# Patient Record
Sex: Female | Born: 1965 | Race: White | Hispanic: No | Marital: Married | Smoking: Never smoker
Health system: Southern US, Community
[De-identification: ages and names within clinical notes are randomized; demographics above are authoritative.]

---

## 1997-11-10 ENCOUNTER — Other Ambulatory Visit: Admission: RE | Admit: 1997-11-10 | Discharge: 1997-11-10 | Payer: Self-pay | Admitting: Obstetrics and Gynecology

## 1999-03-11 ENCOUNTER — Inpatient Hospital Stay (HOSPITAL_COMMUNITY): Admission: AD | Admit: 1999-03-11 | Discharge: 1999-03-13 | Payer: Self-pay | Admitting: Obstetrics and Gynecology

## 1999-03-16 ENCOUNTER — Encounter (HOSPITAL_COMMUNITY): Admission: RE | Admit: 1999-03-16 | Discharge: 1999-06-14 | Payer: Self-pay | Admitting: *Deleted

## 1999-05-05 ENCOUNTER — Other Ambulatory Visit: Admission: RE | Admit: 1999-05-05 | Discharge: 1999-05-05 | Payer: Self-pay | Admitting: Gynecology

## 1999-06-16 ENCOUNTER — Encounter: Admission: RE | Admit: 1999-06-16 | Discharge: 1999-09-14 | Payer: Self-pay | Admitting: *Deleted

## 2000-12-15 ENCOUNTER — Other Ambulatory Visit: Admission: RE | Admit: 2000-12-15 | Discharge: 2000-12-15 | Payer: Self-pay | Admitting: Obstetrics and Gynecology

## 2002-04-12 ENCOUNTER — Other Ambulatory Visit: Admission: RE | Admit: 2002-04-12 | Discharge: 2002-04-12 | Payer: Self-pay | Admitting: Obstetrics and Gynecology

## 2002-07-20 ENCOUNTER — Inpatient Hospital Stay (HOSPITAL_COMMUNITY): Admission: AD | Admit: 2002-07-20 | Discharge: 2002-07-20 | Payer: Self-pay | Admitting: *Deleted

## 2002-10-19 ENCOUNTER — Inpatient Hospital Stay (HOSPITAL_COMMUNITY): Admission: AD | Admit: 2002-10-19 | Discharge: 2002-10-19 | Payer: Self-pay | Admitting: Obstetrics and Gynecology

## 2002-10-30 ENCOUNTER — Inpatient Hospital Stay (HOSPITAL_COMMUNITY): Admission: AD | Admit: 2002-10-30 | Discharge: 2002-11-01 | Payer: Self-pay | Admitting: Obstetrics and Gynecology

## 2002-11-02 ENCOUNTER — Encounter: Admission: RE | Admit: 2002-11-02 | Discharge: 2002-12-02 | Payer: Self-pay | Admitting: Obstetrics and Gynecology

## 2002-12-03 ENCOUNTER — Encounter: Admission: RE | Admit: 2002-12-03 | Discharge: 2003-01-02 | Payer: Self-pay | Admitting: Obstetrics and Gynecology

## 2002-12-13 ENCOUNTER — Other Ambulatory Visit: Admission: RE | Admit: 2002-12-13 | Discharge: 2002-12-13 | Payer: Self-pay | Admitting: Obstetrics and Gynecology

## 2003-02-02 ENCOUNTER — Encounter: Admission: RE | Admit: 2003-02-02 | Discharge: 2003-03-04 | Payer: Self-pay | Admitting: Obstetrics and Gynecology

## 2003-03-05 ENCOUNTER — Encounter: Admission: RE | Admit: 2003-03-05 | Discharge: 2003-04-04 | Payer: Self-pay | Admitting: Obstetrics and Gynecology

## 2003-05-06 ENCOUNTER — Ambulatory Visit (HOSPITAL_COMMUNITY): Admission: RE | Admit: 2003-05-06 | Discharge: 2003-05-06 | Payer: Self-pay | Admitting: Orthopedic Surgery

## 2003-05-06 ENCOUNTER — Ambulatory Visit (HOSPITAL_BASED_OUTPATIENT_CLINIC_OR_DEPARTMENT_OTHER): Admission: RE | Admit: 2003-05-06 | Discharge: 2003-05-06 | Payer: Self-pay | Admitting: Orthopedic Surgery

## 2005-03-30 ENCOUNTER — Emergency Department (HOSPITAL_COMMUNITY): Admission: EM | Admit: 2005-03-30 | Discharge: 2005-03-30 | Payer: Self-pay | Admitting: Emergency Medicine

## 2005-08-31 ENCOUNTER — Other Ambulatory Visit: Admission: RE | Admit: 2005-08-31 | Discharge: 2005-08-31 | Payer: Self-pay | Admitting: Obstetrics and Gynecology

## 2006-09-28 ENCOUNTER — Encounter: Admission: RE | Admit: 2006-09-28 | Discharge: 2006-09-28 | Payer: Self-pay | Admitting: Obstetrics and Gynecology

## 2008-06-12 ENCOUNTER — Encounter: Admission: RE | Admit: 2008-06-12 | Discharge: 2008-06-12 | Payer: Self-pay | Admitting: Obstetrics and Gynecology

## 2008-10-13 ENCOUNTER — Other Ambulatory Visit (HOSPITAL_COMMUNITY): Admission: RE | Admit: 2008-10-13 | Discharge: 2008-10-13 | Payer: Self-pay | Admitting: Psychiatry

## 2009-04-04 ENCOUNTER — Encounter: Admission: RE | Admit: 2009-04-04 | Discharge: 2009-04-04 | Payer: Self-pay | Admitting: Family Medicine

## 2009-04-13 ENCOUNTER — Encounter: Admission: RE | Admit: 2009-04-13 | Discharge: 2009-04-13 | Payer: Self-pay | Admitting: Family Medicine

## 2009-06-15 ENCOUNTER — Encounter: Admission: RE | Admit: 2009-06-15 | Discharge: 2009-06-15 | Payer: Self-pay | Admitting: Obstetrics and Gynecology

## 2010-08-31 ENCOUNTER — Other Ambulatory Visit: Payer: Self-pay | Admitting: Obstetrics and Gynecology

## 2010-08-31 DIAGNOSIS — Z1231 Encounter for screening mammogram for malignant neoplasm of breast: Secondary | ICD-10-CM

## 2010-09-02 ENCOUNTER — Ambulatory Visit: Payer: Self-pay

## 2010-09-10 ENCOUNTER — Ambulatory Visit
Admission: RE | Admit: 2010-09-10 | Discharge: 2010-09-10 | Disposition: A | Discharge status: Home / Self Care | Payer: BC Managed Care – PPO | Source: Ambulatory Visit | Attending: Obstetrics and Gynecology | Admitting: Obstetrics and Gynecology

## 2010-09-10 DIAGNOSIS — Z1231 Encounter for screening mammogram for malignant neoplasm of breast: Secondary | ICD-10-CM

## 2010-11-19 NOTE — Op Note (Signed)
NAMESOMAYA, Becky Sawyer                  ACCOUNT NO.:  [id number]   MEDICAL RECORD NO.:  [id number]                   PATIENT TYPE:  AMB   LOCATION:  DSC                                  FACILITY:  [facility]   PHYSICIAN:  Katy Fitch. Naaman Plummer., M.D.          DATE OF BIRTH:  [birth year]   DATE OF PROCEDURE:  05/07/2003  DATE OF DISCHARGE:                                 OPERATIVE REPORT   PREOPERATIVE DIAGNOSES:  Impacted angulated comminuted fracture of right  small finger, metacarpal head and neck, intra-articular at metacarpal  phalangeal joint.   POSTOPERATIVE DIAGNOSES:  Impacted angulated comminuted fracture of right  small finger, metacarpal head and neck, intra-articular at metacarpal  phalangeal joint.   OPERATION:  Closed reduction, right small finger metacarpal head and neck  fracture with a percutaneous 0.035 inch and 0.045 inch Kirschner wire  fixation of articular head and neck fracture.   SURGEON:  Katy Fitch. Sypher, M.D.   ASSISTANT:  Jonni Sanger, P.A.   ANESTHESIA:  General by LMA.   ANESTHESIOLOGIST:  Maren Beach, M.D.   INDICATIONS FOR PROCEDURE:  The patient is a 45 year old, right-hand  dominant woman who fractured her right small finger metacarpal head and neck  two days prior to surgery.  She was seen at [facility], where x-rays revealed a displaced impacted angulated intra-  articular fracture of her right small finger metacarpal.  She was referred  for an urgent hand surgery consultation on May 05, 2003.  A clinical  examination at that time revealed gross swelling of her hand.  She had both  angular deformity of her metacarpal apex dorsal, and shortening of her  metacarpal.  She had an extensor lag at her PIP joint.  Her x-rays were  reviewed, documenting her fracture.  We advised that closed treatment of this would be unsuccessful, and would  likely lead to shortening of the metacarpal length, loss of the  contour of  the knuckle, and  a PIP extensor lag.  We recommended a delayed primary  reduction and Kirschner wire fixation, versus an open reduction and internal  fixation.  After an informed consent, she is brought to the operating room  at this time.   DESCRIPTION OF PROCEDURE:  The patient is brought to the operating room and  placed in the supine position on the operating room table.  Following  induction of general anesthesia by LMA, the right arm was prepped with  Betadine soap and solution and sterilely draped.  A C-arm fluoroscope was  brought into position, and the fracture examined.  After exsanguination of  the limb with an Esmarch bandage, an arterial tourniquet on the proximal  brachium was inflated to 220 mmHg.  A finger trap was initially used to  reduce the fracture with traction and three-point molding.  Once the  fracture was reduced, we carefully placed the finger into 90/90 position to  allow proper alignment with respect to  rotation.  The fracture remained  anatomic.  We then secured the fracture with an intramedullary 0.035 inch  Kirschner wire drilled through the extensor mechanism with the MP joint at  80 degrees flexion, and the IP joint in full flexion, followed by a  transverse pin through the distal portion of the articular fracture,  securing the head fragments into the ring finger metacarpal.  An anatomic  reduction was achieved.  The pins were then prepped in the usual manner with Xeroflo on the skin and  pin caps.  A compressive dressing was applied with dorsal and palmar plaster  sandwich splints, maintaining the hand in a safe position.   DISPOSITION/FOLLOWUP:  For after-care the patient is advised to keep her  dressing meticulously dry and clean.  As she has a 59-month-old child, she  will need considerable help from her family.  We anticipate her return to  the office for a check x-ray in seven days, and for after-care she was given  prescriptions for  Percocet 5 mg, one or two tab p.o. q.4-6h. p.r.n. pain and  _________5 mg, one p.o. q.8h. x4 days as a prophylactic antibiotic.   COMPLICATIONS:  There were no apparent complications.  She is awakened from anesthesia and transferred to the recovery room with  stable vital signs.                                               Katy Fitch Naaman Plummer., M.D.    RVS/MEDQ  D:  05/06/2003  T:  05/07/2003  Job:  [id number]   cc:   Candyce Churn, M.D.  [address] [place]  [place] [address]  Fax: [phone]

## 2012-10-24 ENCOUNTER — Other Ambulatory Visit: Payer: Self-pay | Admitting: Obstetrics and Gynecology

## 2012-10-24 DIAGNOSIS — N644 Mastodynia: Secondary | ICD-10-CM

## 2012-10-25 ENCOUNTER — Ambulatory Visit
Admission: RE | Admit: 2012-10-25 | Discharge: 2012-10-25 | Disposition: A | Discharge status: Home / Self Care | Payer: BC Managed Care – PPO | Source: Ambulatory Visit | Attending: Obstetrics and Gynecology | Admitting: Obstetrics and Gynecology

## 2012-10-25 DIAGNOSIS — N644 Mastodynia: Secondary | ICD-10-CM

## 2013-09-19 ENCOUNTER — Other Ambulatory Visit: Payer: Self-pay

## 2013-09-19 DIAGNOSIS — Z1231 Encounter for screening mammogram for malignant neoplasm of breast: Secondary | ICD-10-CM

## 2013-10-28 ENCOUNTER — Ambulatory Visit: Payer: BC Managed Care – PPO

## 2014-02-18 ENCOUNTER — Ambulatory Visit: Payer: BC Managed Care – PPO

## 2014-03-21 ENCOUNTER — Encounter (INDEPENDENT_AMBULATORY_CARE_PROVIDER_SITE_OTHER): Payer: Self-pay

## 2014-03-21 ENCOUNTER — Ambulatory Visit
Admission: RE | Admit: 2014-03-21 | Discharge: 2014-03-21 | Disposition: A | Discharge status: Home / Self Care | Payer: BC Managed Care – PPO | Source: Ambulatory Visit

## 2014-03-21 DIAGNOSIS — Z1231 Encounter for screening mammogram for malignant neoplasm of breast: Secondary | ICD-10-CM

## 2015-04-01 ENCOUNTER — Other Ambulatory Visit: Payer: Self-pay

## 2015-04-01 DIAGNOSIS — Z1231 Encounter for screening mammogram for malignant neoplasm of breast: Secondary | ICD-10-CM

## 2015-04-07 ENCOUNTER — Ambulatory Visit
Admission: RE | Admit: 2015-04-07 | Discharge: 2015-04-07 | Disposition: A | Discharge status: Home / Self Care | Payer: BLUE CROSS/BLUE SHIELD | Source: Ambulatory Visit

## 2015-04-07 DIAGNOSIS — Z1231 Encounter for screening mammogram for malignant neoplasm of breast: Secondary | ICD-10-CM

## 2016-02-03 DIAGNOSIS — Z85828 Personal history of other malignant neoplasm of skin: Secondary | ICD-10-CM | POA: Insufficient documentation

## 2016-03-31 ENCOUNTER — Other Ambulatory Visit: Payer: Self-pay | Admitting: Obstetrics and Gynecology

## 2016-03-31 DIAGNOSIS — Z1231 Encounter for screening mammogram for malignant neoplasm of breast: Secondary | ICD-10-CM

## 2016-04-22 ENCOUNTER — Ambulatory Visit: Payer: BLUE CROSS/BLUE SHIELD

## 2016-06-13 ENCOUNTER — Ambulatory Visit: Payer: BLUE CROSS/BLUE SHIELD

## 2016-08-17 ENCOUNTER — Ambulatory Visit
Admission: RE | Admit: 2016-08-17 | Discharge: 2016-08-17 | Disposition: A | Discharge status: Home / Self Care | Payer: BLUE CROSS/BLUE SHIELD | Source: Ambulatory Visit | Attending: Obstetrics and Gynecology | Admitting: Obstetrics and Gynecology

## 2016-08-17 DIAGNOSIS — Z1231 Encounter for screening mammogram for malignant neoplasm of breast: Secondary | ICD-10-CM

## 2016-11-25 DIAGNOSIS — L988 Other specified disorders of the skin and subcutaneous tissue: Secondary | ICD-10-CM | POA: Insufficient documentation

## 2019-04-29 ENCOUNTER — Other Ambulatory Visit: Payer: Self-pay | Admitting: Obstetrics and Gynecology

## 2019-04-29 DIAGNOSIS — R5381 Other malaise: Secondary | ICD-10-CM

## 2019-05-03 ENCOUNTER — Other Ambulatory Visit: Payer: Self-pay | Admitting: Obstetrics and Gynecology

## 2019-05-03 DIAGNOSIS — M940 Chondrocostal junction syndrome [Tietze]: Secondary | ICD-10-CM | POA: Insufficient documentation

## 2019-05-03 DIAGNOSIS — N644 Mastodynia: Secondary | ICD-10-CM

## 2019-05-15 ENCOUNTER — Other Ambulatory Visit: Payer: BLUE CROSS/BLUE SHIELD

## 2019-07-25 DIAGNOSIS — C44712 Basal cell carcinoma of skin of right lower limb, including hip: Secondary | ICD-10-CM | POA: Diagnosis not present

## 2019-07-25 DIAGNOSIS — L700 Acne vulgaris: Secondary | ICD-10-CM | POA: Diagnosis not present

## 2019-08-06 ENCOUNTER — Ambulatory Visit: Payer: Self-pay | Attending: Internal Medicine

## 2019-08-12 DIAGNOSIS — E282 Polycystic ovarian syndrome: Secondary | ICD-10-CM | POA: Diagnosis not present

## 2019-08-12 DIAGNOSIS — R7301 Impaired fasting glucose: Secondary | ICD-10-CM | POA: Diagnosis not present

## 2019-08-19 DIAGNOSIS — L709 Acne, unspecified: Secondary | ICD-10-CM | POA: Diagnosis not present

## 2019-08-19 DIAGNOSIS — E282 Polycystic ovarian syndrome: Secondary | ICD-10-CM | POA: Diagnosis not present

## 2019-08-19 DIAGNOSIS — R7301 Impaired fasting glucose: Secondary | ICD-10-CM | POA: Diagnosis not present

## 2019-08-19 DIAGNOSIS — L680 Hirsutism: Secondary | ICD-10-CM | POA: Diagnosis not present

## 2019-09-19 DIAGNOSIS — F3342 Major depressive disorder, recurrent, in full remission: Secondary | ICD-10-CM | POA: Diagnosis not present

## 2019-10-26 ENCOUNTER — Ambulatory Visit: Payer: Self-pay

## 2020-01-07 ENCOUNTER — Other Ambulatory Visit: Payer: Self-pay

## 2020-01-17 ENCOUNTER — Ambulatory Visit
Admission: RE | Admit: 2020-01-17 | Discharge: 2020-01-17 | Disposition: A | Discharge status: Home / Self Care | Payer: Self-pay | Source: Ambulatory Visit | Attending: Obstetrics and Gynecology | Admitting: Obstetrics and Gynecology

## 2020-01-17 ENCOUNTER — Other Ambulatory Visit: Payer: Self-pay

## 2020-01-17 DIAGNOSIS — N644 Mastodynia: Secondary | ICD-10-CM

## 2020-07-29 IMAGING — MG DIGITAL DIAGNOSTIC BILAT W/ TOMO W/ CAD
6 of 10 series · 6 of 30 positions shown · non-contrast
Comparison: Previous exam(s).

CLINICAL DATA: 53-year-old presenting with intermittent focal pain
in the UPPER subareolar LEFT breast over the past several weeks.
Annual evaluation, RIGHT breast.

EXAM:
DIGITAL DIAGNOSTIC BILATERAL MAMMOGRAM WITH CAD AND TOMO
ULTRASOUND LEFT BREAST

[L MLO synth-2D (1 of 2)]
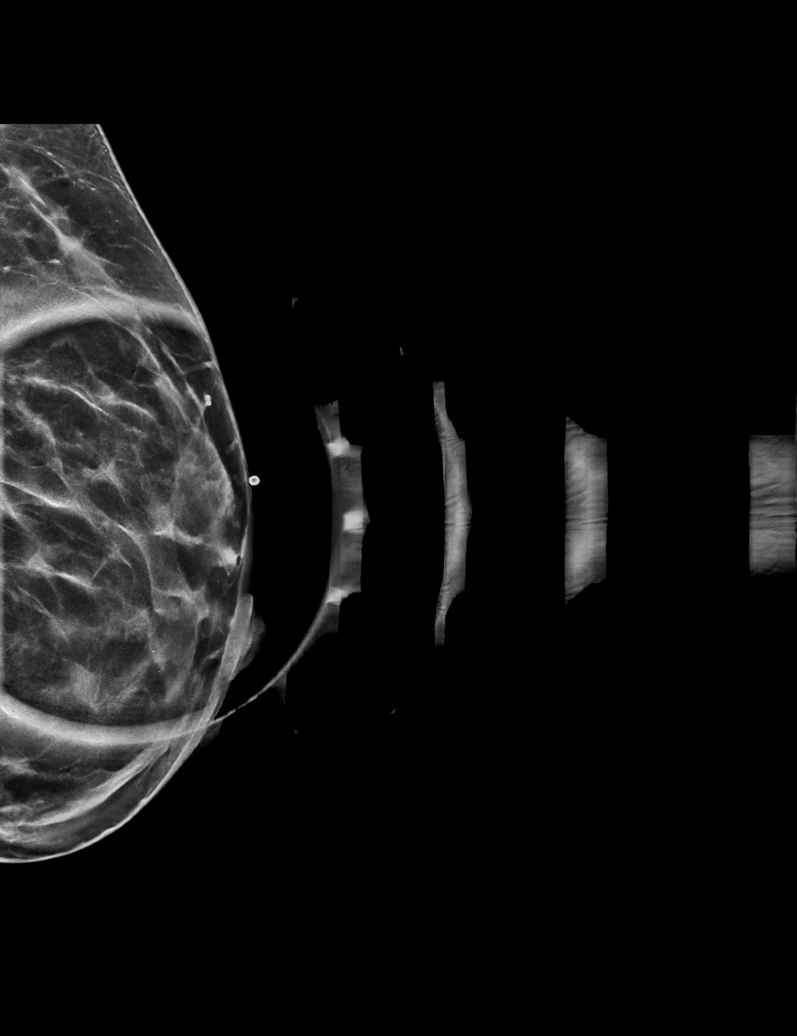

[L MLO synth-2D (2 of 2)]
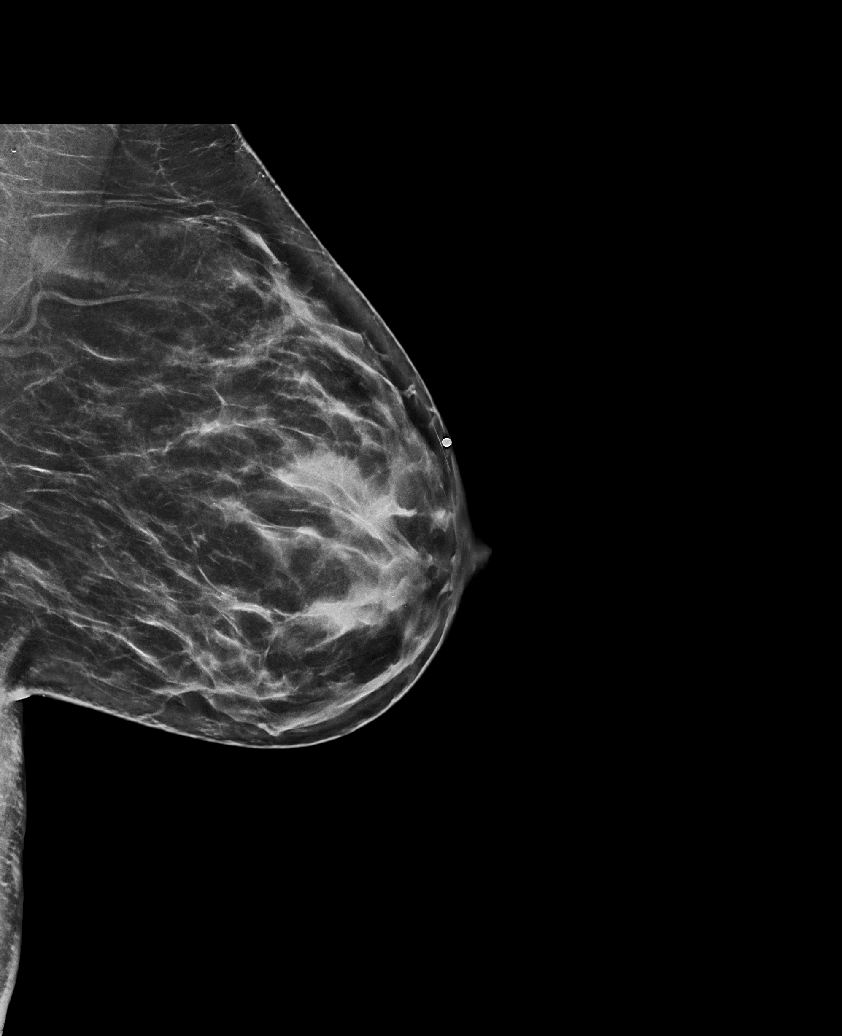

[L CC synth-2D]
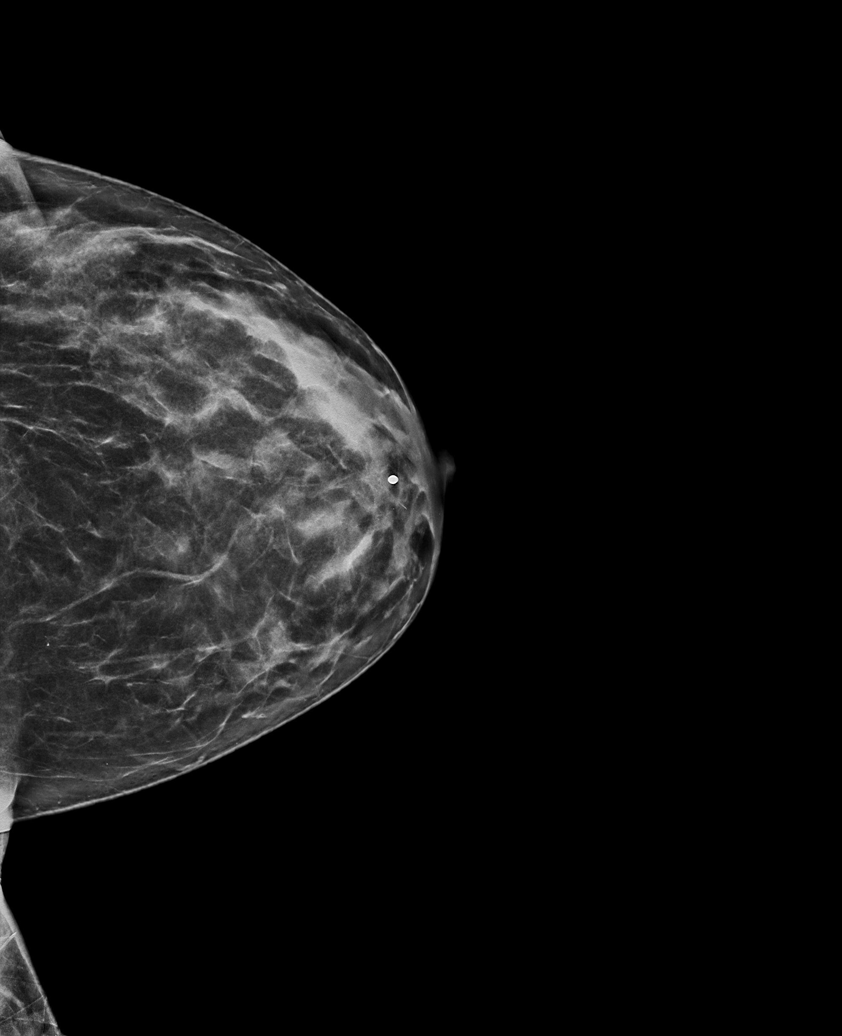

[R CC synth-2D]
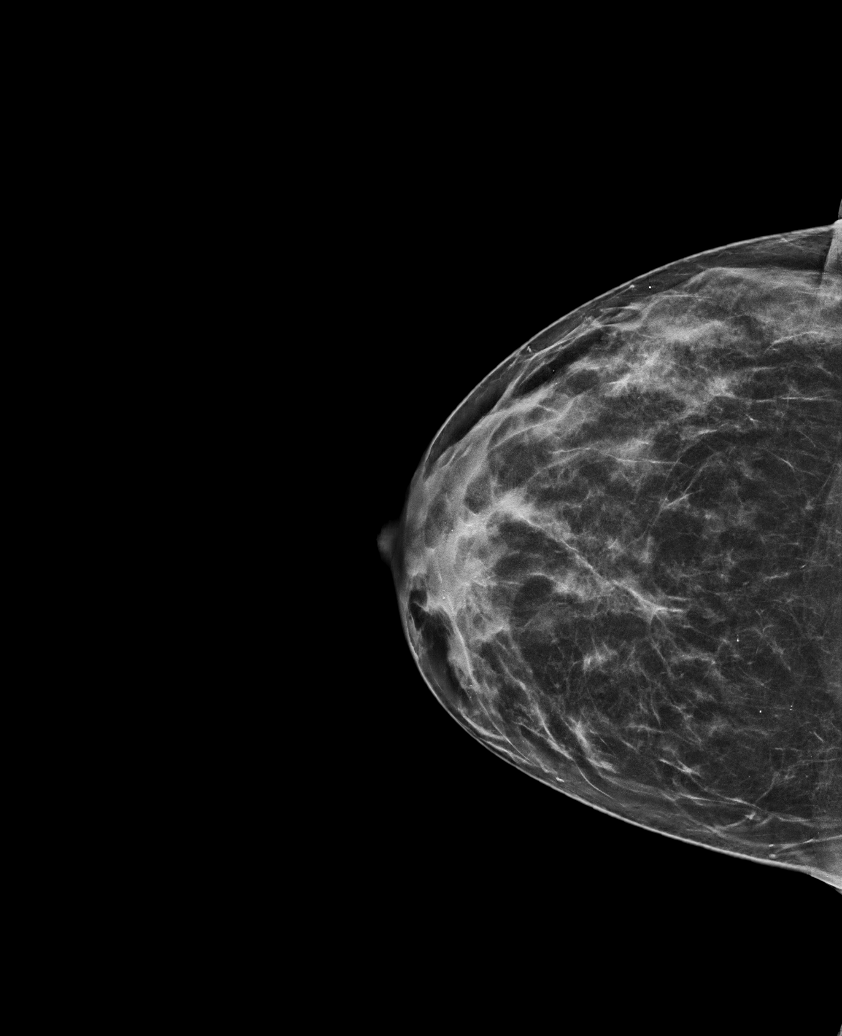

[R MLO synth-2D]
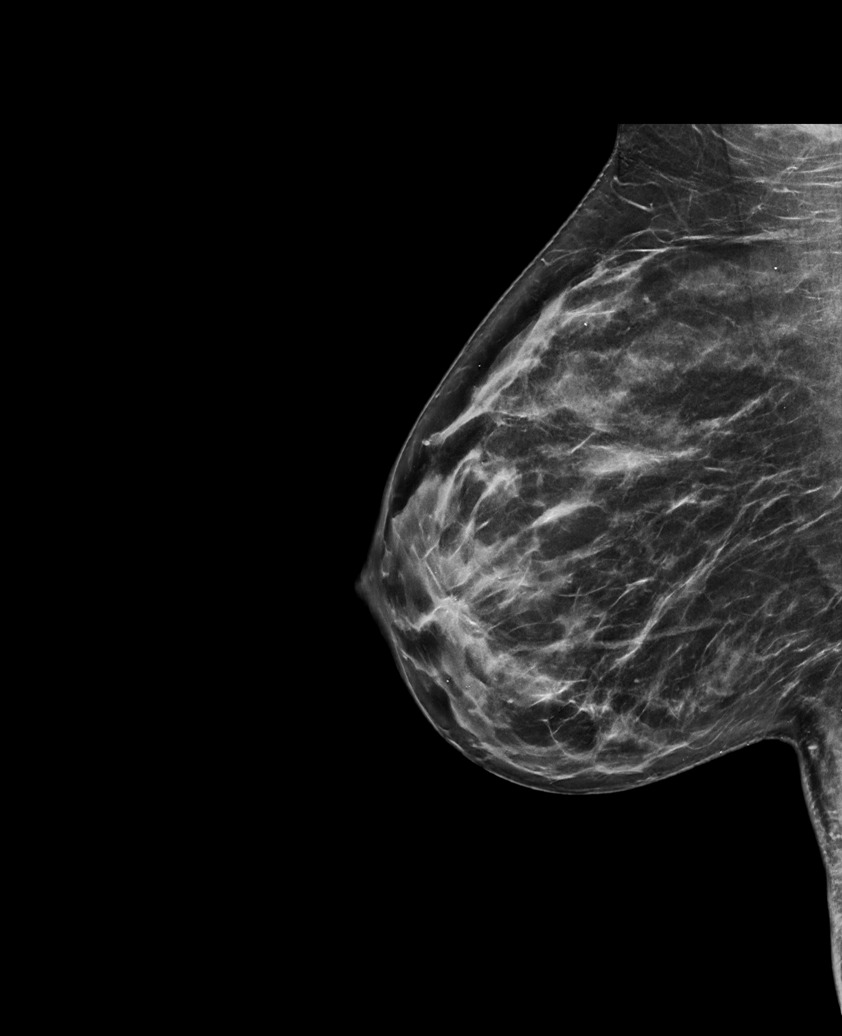

[L CC tomo · tomo slice 35/69.0]
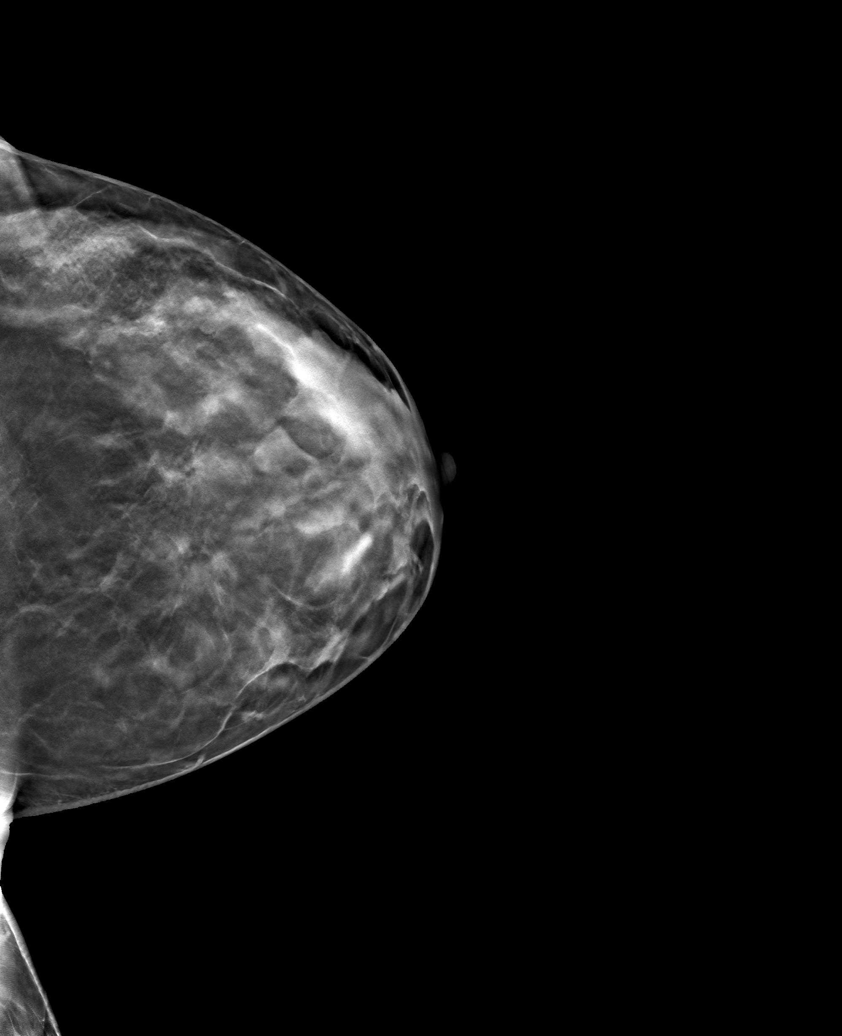

[6 of 30 positions shown; findings below may reference images not displayed]

ACR Breast Density Category c: The breast tissue is heterogeneously
dense, which may obscure small masses.
FINDINGS: Tomosynthesis and synthesized full field CC and MLO views of both
breasts were obtained. Tomosynthesis and synthesized spot
compression tangential view of the area of concern in the LEFT
breast was also obtained.

No mammographic abnormality in the UPPER subareolar LEFT breast in
the area of intermittent focal pain. Normal dense fibroglandular
tissue is present in this location. No findings suspicious for
malignancy in the LEFT breast.

No findings suspicious for malignancy in the RIGHT breast.

Mammographic images were processed with CAD.

Targeted LEFT breast ultrasound is performed, showing adjacent
benign simple cysts at the 12 o'clock position approximately 1 cm
from the nipple, the larger cyst measuring approximately 1.0 x 0.5 x
0.9 cm, both demonstrating posterior acoustic enhancement and no
internal power Doppler flow. No suspicious solid mass or abnormal
acoustic shadowing is identified.
IMPRESSION: 1. No mammographic or sonographic evidence of malignancy involving
the LEFT breast.
2. No mammographic evidence of malignancy involving the RIGHT
breast.
3. Benign simple cysts in the UPPER subareolar LEFT breast.

RECOMMENDATION:
Screening mammogram in one year.(Code:GL-C-48V)

Strategies for alleviating breast pain including decreasing caffeine
intake and vitamin-E supplementation were discussed with the
patient.

I have discussed the findings and recommendations with the patient.
If applicable, a reminder letter will be sent to the patient
regarding the next appointment.

BI-RADS CATEGORY  2: Benign.

## 2021-04-13 ENCOUNTER — Ambulatory Visit: Payer: Self-pay | Admitting: Neurology

## 2021-07-20 ENCOUNTER — Other Ambulatory Visit: Payer: Self-pay | Admitting: Obstetrics and Gynecology

## 2021-07-20 DIAGNOSIS — Z1231 Encounter for screening mammogram for malignant neoplasm of breast: Secondary | ICD-10-CM

## 2021-07-23 ENCOUNTER — Other Ambulatory Visit: Payer: Self-pay | Admitting: Nurse Practitioner

## 2021-07-23 DIAGNOSIS — R928 Other abnormal and inconclusive findings on diagnostic imaging of breast: Secondary | ICD-10-CM

## 2021-08-03 ENCOUNTER — Other Ambulatory Visit: Payer: Self-pay | Admitting: Nurse Practitioner

## 2021-08-03 DIAGNOSIS — N6310 Unspecified lump in the right breast, unspecified quadrant: Secondary | ICD-10-CM

## 2021-08-17 ENCOUNTER — Other Ambulatory Visit: Payer: Self-pay

## 2021-09-13 ENCOUNTER — Encounter: Payer: Self-pay | Admitting: Neurology

## 2021-09-13 ENCOUNTER — Telehealth: Payer: Self-pay | Admitting: Neurology

## 2021-09-13 ENCOUNTER — Ambulatory Visit: Payer: PRIVATE HEALTH INSURANCE | Admitting: Neurology

## 2021-09-13 VITALS — BP 100/68 | HR 97 | Ht 65.5 in | Wt 172.0 lb

## 2021-09-13 DIAGNOSIS — D2370 Other benign neoplasm of skin of unspecified lower limb, including hip: Secondary | ICD-10-CM | POA: Insufficient documentation

## 2021-09-13 DIAGNOSIS — F988 Other specified behavioral and emotional disorders with onset usually occurring in childhood and adolescence: Secondary | ICD-10-CM

## 2021-09-13 DIAGNOSIS — D225 Melanocytic nevi of trunk: Secondary | ICD-10-CM | POA: Insufficient documentation

## 2021-09-13 DIAGNOSIS — L719 Rosacea, unspecified: Secondary | ICD-10-CM | POA: Insufficient documentation

## 2021-09-13 DIAGNOSIS — R413 Other amnesia: Secondary | ICD-10-CM

## 2021-09-13 DIAGNOSIS — Z808 Family history of malignant neoplasm of other organs or systems: Secondary | ICD-10-CM | POA: Insufficient documentation

## 2021-09-13 DIAGNOSIS — Z87898 Personal history of other specified conditions: Secondary | ICD-10-CM | POA: Insufficient documentation

## 2021-09-13 NOTE — Telephone Encounter (Signed)
Referral sent to [facility] [phone]. ?
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
## 2021-09-13 NOTE — Telephone Encounter (Signed)
Referral sent to [facility] Attention Specialist [phone]. ?

## 2021-09-13 NOTE — Patient Instructions (Signed)
Continue current medications ?Referral to Neuropsych  ?Referral to Psychiatry [facility]) for management of ADD ?Follow up with your primary care physician  ?Return in 1 year  ? ? ?There are well-accepted and sensible ways to reduce risk for Alzheimers disease and other degenerative brain disorders . ? ?Exercise Daily Walk A daily 20 minute walk should be part of your routine. Disease related apathy can be a significant roadblock to exercise and the only way to overcome this is to make it a daily routine and perhaps have a reward at the end (something your loved one loves to eat or drink perhaps) or a personal trainer coming to the home can also be very useful. Most importantly, the patient is much more likely to exercise if the caregiver / spouse does it with him/her. In general a structured, repetitive schedule is best. ? ?General Health: Any diseases which effect your body will effect your brain such as a pneumonia, urinary infection, blood clot, heart attack or stroke. Keep contact with your primary care doctor for regular follow ups. ? ?Sleep. A good nights sleep is healthy for the brain. Seven hours is recommended. If you have insomnia or poor sleep habits we can give you some instructions. If you have sleep apnea wear your mask. ? ?Diet: Eating a heart healthy diet is also a good idea; fish and poultry instead of red meat, nuts (mostly non-peanuts), vegetables, fruits, olive oil or canola oil (instead of butter), minimal salt (use other spices to flavor foods), whole grain rice, bread, cereal and pasta and wine in moderation.Research is now showing that the MIND diet, which is a combination of The Mediterranean diet and the DASH diet, is beneficial for cognitive processing and longevity. Information about this diet can be found in The MIND Diet, a book by Doyne Keel, MS, RDN, and online at [employer].si ? ?Finances, Power of Producer, television/film/video  Directives: You should consider putting legal safeguards in place with regard to financial and medical decision making. While the spouse always has power of attorney for medical and financial issues in the absence of any form, you should consider what you want in case the spouse / caregiver is no longer around or capable of making decisions.  ? ? ? ?Heart-head connection ? ?New research shows there are things we can do to reduce the risk of mild cognitive impairment and dementia. ? ?Several conditions known to increase the risk of cardiovascular disease -- such as high blood pressure, diabetes and high cholesterol -- also increase the risk of developing Alzheimer's. Some autopsy studies show that as many as 58 percent of individuals with Alzheimer's disease also have cardiovascular disease. ? ?A longstanding question is why some people develop hallmark Alzheimer's plaques and tangles but do not develop the symptoms of Alzheimer's. Vascular disease may help researchers eventually find an answer. Some autopsy studies suggest that plaques and tangles may be present in the brain without causing symptoms of cognitive decline unless the brain also shows evidence of vascular disease. More research is needed to better understand the link between vascular health and Alzheimer's. ? ?Physical exercise and diet ?Regular physical exercise may be a beneficial strategy to lower the risk of Alzheimer's and vascular dementia. Exercise may directly benefit brain cells by increasing blood and oxygen flow in the brain. Because of its known cardiovascular benefits, a medically approved exercise program is a valuable part of any overall wellness plan. ? ?Current evidence suggests that heart-healthy eating may also help protect  the brain. Heart-healthy eating includes limiting the intake of sugar and saturated fats and making sure to eat plenty of fruits, vegetables, and whole grains. No one diet is best. Two diets that have been studied  and may be beneficial are the DASH (Dietary Approaches to Stop Hypertension) diet and the Mediterranean diet. The DASH diet emphasizes vegetables, fruits and fat-free or low-fat dairy products; includes whole grains, fish, poultry, beans, seeds, nuts and vegetable oils; and limits sodium, sweets, sugary beverages and red meats. A Mediterranean diet includes relatively little red meat and emphasizes whole grains, fruits and vegetables, fish and shellfish, and nuts, olive oil and other healthy fats. ? ?Social connections and intellectual activity ?A number of studies indicate that maintaining strong social connections and keeping mentally active as we age might lower the risk of cognitive decline and Alzheimer's. Experts are not certain about the reason for this association. It may be due to direct mechanisms through which social and mental stimulation strengthen connections between nerve cells in the brain. ? ?Head trauma ?There appears to be a strong link between future risk of Alzheimer's and serious head trauma, especially when injury involves loss of consciousness. You can help reduce your risk of Alzheimer's by protecting your head. ? Wear a seat belt ? Use a helmet when participating in sports ? "Fall-proof" your home ?  ?What you can do now ?While research is not yet conclusive, certain lifestyle choices, such as physical activity and diet, may help support brain health and prevent Alzheimer's. Many of these lifestyle changes have been shown to lower the risk of other diseases, like heart disease and diabetes, which have been linked to Alzheimer's. With few drawbacks and plenty of known benefits, healthy lifestyle choices can improve your health and possibly protect your brain. ? ?Learn more about brain health. ?You can help increase our knowledge by considering participation in a clinical study. Our free clinical trial matching services, TrialMatch?, can help you find clinical trials in your area that are  seeking volunteers. ? ?  ?

## 2021-09-14 LAB — DEMENTIA PANEL
Homocysteine: 7.5 umol/L (ref 0.0–14.5)
RPR Ser Ql: NONREACTIVE
TSH: 0.987 u[IU]/mL (ref 0.450–4.500)
Vitamin B-12: 447 pg/mL (ref 232–1245)

## 2021-09-15 ENCOUNTER — Telehealth: Payer: Self-pay | Admitting: *Deleted

## 2021-09-15 NOTE — Telephone Encounter (Signed)
Called and spoke with pt to inform of normal results. Patient verbalized understanding and expressed appreciation for the call. All questions answered. ?

## 2021-09-15 NOTE — Progress Notes (Signed)
Please call and advise the patient that the recent dementia labs we checked were within normal limits. We checked vitamin B12 level, homocysteine,  thyroid function and RPR. No further action is required on these tests at this time. Please remind patient to keep any upcoming appointments or tests and to call us with any interim questions, concerns, problems or updates. Thanks,  ? ?Becky Ran, MD ? ?

## 2021-09-15 NOTE — Telephone Encounter (Signed)
-----   Message from Alric Ran, MD sent at 09/15/2021  8:33 AM EDT ----- ?Please call and advise the patient that the recent dementia labs we checked were within normal limits. We checked vitamin B12 level, homocysteine,  thyroid function and RPR. No further action is required on these tests at this time. Please remind patient to keep any upcoming appointments or tests and to call us with any interim questions, concerns, problems or updates. Thanks,  ? ?Alric Ran, MD ? ?  ?

## 2021-09-21 NOTE — Telephone Encounter (Signed)
[employer] sent a message stating they have made several attempts to contact the patient to schedule- patient has not returned or answer their calls.  ?

## 2022-02-16 ENCOUNTER — Other Ambulatory Visit: Payer: Self-pay | Admitting: Obstetrics and Gynecology

## 2022-02-16 DIAGNOSIS — Z1231 Encounter for screening mammogram for malignant neoplasm of breast: Secondary | ICD-10-CM

## 2022-03-10 ENCOUNTER — Ambulatory Visit
Admission: RE | Admit: 2022-03-10 | Discharge: 2022-03-10 | Disposition: A | Discharge status: Home / Self Care | Payer: No Typology Code available for payment source | Source: Ambulatory Visit | Attending: Obstetrics and Gynecology | Admitting: Obstetrics and Gynecology

## 2022-03-10 DIAGNOSIS — Z1231 Encounter for screening mammogram for malignant neoplasm of breast: Secondary | ICD-10-CM

## 2022-08-08 DIAGNOSIS — E669 Obesity, unspecified: Secondary | ICD-10-CM | POA: Diagnosis not present

## 2022-08-08 DIAGNOSIS — R7301 Impaired fasting glucose: Secondary | ICD-10-CM | POA: Diagnosis not present

## 2022-08-08 DIAGNOSIS — E282 Polycystic ovarian syndrome: Secondary | ICD-10-CM | POA: Diagnosis not present

## 2022-08-15 DIAGNOSIS — E669 Obesity, unspecified: Secondary | ICD-10-CM | POA: Diagnosis not present

## 2022-08-15 DIAGNOSIS — R7301 Impaired fasting glucose: Secondary | ICD-10-CM | POA: Diagnosis not present

## 2022-08-15 DIAGNOSIS — E785 Hyperlipidemia, unspecified: Secondary | ICD-10-CM | POA: Diagnosis not present

## 2022-09-01 DIAGNOSIS — Z79899 Other long term (current) drug therapy: Secondary | ICD-10-CM | POA: Diagnosis not present

## 2022-09-01 DIAGNOSIS — L700 Acne vulgaris: Secondary | ICD-10-CM | POA: Diagnosis not present

## 2022-09-05 DIAGNOSIS — F331 Major depressive disorder, recurrent, moderate: Secondary | ICD-10-CM | POA: Diagnosis not present

## 2022-09-05 DIAGNOSIS — F900 Attention-deficit hyperactivity disorder, predominantly inattentive type: Secondary | ICD-10-CM | POA: Diagnosis not present

## 2022-09-05 DIAGNOSIS — G4700 Insomnia, unspecified: Secondary | ICD-10-CM | POA: Diagnosis not present

## 2022-09-13 DIAGNOSIS — M542 Cervicalgia: Secondary | ICD-10-CM | POA: Diagnosis not present

## 2022-09-13 DIAGNOSIS — M545 Low back pain, unspecified: Secondary | ICD-10-CM | POA: Diagnosis not present

## 2022-09-15 ENCOUNTER — Ambulatory Visit: Payer: Self-pay | Admitting: Neurology

## 2022-09-16 DIAGNOSIS — M7061 Trochanteric bursitis, right hip: Secondary | ICD-10-CM | POA: Diagnosis not present

## 2022-10-03 ENCOUNTER — Ambulatory Visit: Payer: Self-pay | Admitting: General Surgery

## 2022-10-03 DIAGNOSIS — Z79899 Other long term (current) drug therapy: Secondary | ICD-10-CM | POA: Diagnosis not present

## 2022-10-03 DIAGNOSIS — L700 Acne vulgaris: Secondary | ICD-10-CM | POA: Diagnosis not present

## 2022-10-03 DIAGNOSIS — K641 Second degree hemorrhoids: Secondary | ICD-10-CM | POA: Diagnosis not present

## 2022-10-03 NOTE — H&P (Signed)
PROVIDER:  Monico Blitz, MD   MRN: [id number] DOB: 01-22-1966 DATE OF ENCOUNTER: 10/03/2022   Subjective    Chief Complaint: No chief complaint on file.       History of Present Illness: Becky Sawyer is a 57 y.o. female who is seen today as an office consultation at the request of Dr. Pasty Arch for evaluation of No chief complaint on file.   Patient reports ongoing hemorrhoids issues since the er birth of her child.  She reports occasional leakage and blood on the toilet paper.  Most recent colonoscopy was approximately 1 year ago and normal except for hemorrhoids.  She denies any pain with bowel movements.  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   Review of Systems: A complete review of systems was obtained from the patient.  I have reviewed this information and discussed as appropriate with the patient.  See HPI as well for other ROS.       Medical History: Past Medical History  History reviewed. No pertinent past medical history.        Patient Active Problem List  Diagnosis   Personal history of other malignant neoplasm of skin      Past Surgical History  History reviewed. No pertinent surgical history.      Allergies  No Known Allergies           Current Outpatient Medications on File Prior to Visit  Medication Sig Dispense Refill   ARIPiprazole (ABILIFY) 2 MG tablet Take 2 mg by mouth once daily.       buPROPion (WELLBUTRIN) 75 MG tablet Take 75 mg by mouth 2 (two) times daily.       dextroamphetamine-amphetamine (ADDERALL) 15 mg tablet Take 15 mg by mouth.       traZODone (DESYREL) 50 MG tablet Take 50 mg by mouth 3 (three) times daily       acetaminophen-codeine (TYLENOL #3) 300-30 mg tablet One to two tablets by mouth every four to six hours as needed for pain (Patient not taking: Reported on 10/03/2022) 12 tablet 0   LORCASERIN HCL (BELVIQ ORAL) Take by mouth. (Patient not  taking: Reported on 10/03/2022)       oxyCODONE (ROXICODONE) 5 MG immediate release tablet Take 1 to 2 tablets by mouth every 4 to 6 hours as needed for pain relief (Patient not taking: Reported on 10/03/2022) 12 tablet 0   spironolactone (ALDACTONE) 25 MG tablet Take 25 mg by mouth once daily. (Patient not taking: Reported on 10/03/2022)       temazepam (RESTORIL) 15 mg capsule Take 15 mg by mouth nightly as needed for Sleep. (Patient not taking: Reported on 10/03/2022)        No current facility-administered medications on file prior to visit.      Family History  History reviewed. No pertinent family history.      Social History       Tobacco Use  Smoking Status Never  Smokeless Tobacco Never      Social History  Social History        Socioeconomic History   Marital status: Married  Tobacco Use   Smoking status: Never   Smokeless tobacco: Never  Substance and Sexual Activity   Alcohol use: Yes   Drug use: Never        Objective:         Vitals:    10/03/22 1437  PainSc: 0-No pain  Exam Gen: NAD Abd: soft Rectal: Circumferential external hemorrhoids without inflammation.  Prolapsing right anterior hemorrhoid     Labs, Imaging and Diagnostic Testing:   Procedure: Anoscopy Surgeon: Marcello Moores After the risks and benefits were explained, written consent was obtained for above procedure.  A medical assistant chaperone was present thoroughout the entire procedure.  Anesthesia: none Diagnosis: rectal drainage, bleeding Findings: Grade 2 inflamed right anterior internal hemorrhoid, grade 1 left lateral and grade 2 non-inflamed right posterior     Assessment and Plan:  Diagnoses and all orders for this visit:   Grade II hemorrhoids       Patient appears to have a prolapsing right anterior hemorrhoid which is causing most of her symptoms.  We had to do band this in the office today but there was no area to band that was not innervated.  I recommended surgical  excision.  We discussed this in detail including postoperative pain and return to work.  She would like to have the least amount of surgery done to correct her problem.  She feels that she has a poor pain tolerance and needs to get back to work as quick as possible.   All questions were answered.     Rosario Adie, MD Colon and Rectal Surgery [facility]

## 2022-10-20 ENCOUNTER — Other Ambulatory Visit (HOSPITAL_COMMUNITY): Payer: Self-pay

## 2022-10-20 ENCOUNTER — Other Ambulatory Visit: Payer: Self-pay

## 2022-10-21 ENCOUNTER — Other Ambulatory Visit (HOSPITAL_COMMUNITY): Payer: Self-pay

## 2022-10-21 MED ORDER — ZEPBOUND 5 MG/0.5ML ~~LOC~~ SOAJ
5.0000 mg | SUBCUTANEOUS | 5 refills | Status: AC
  Filled 2022-10-21: qty 2, 28d supply, fill #0

## 2022-10-24 ENCOUNTER — Other Ambulatory Visit (HOSPITAL_COMMUNITY): Payer: Self-pay

## 2022-10-25 ENCOUNTER — Other Ambulatory Visit: Payer: Self-pay

## 2022-10-31 ENCOUNTER — Other Ambulatory Visit (HOSPITAL_COMMUNITY): Payer: Self-pay

## 2022-10-31 DIAGNOSIS — E282 Polycystic ovarian syndrome: Secondary | ICD-10-CM | POA: Diagnosis not present

## 2022-10-31 MED ORDER — ZEPBOUND 5 MG/0.5ML ~~LOC~~ SOAJ
5.0000 mg | SUBCUTANEOUS | 1 refills | Status: AC
  Filled 2022-10-31: qty 2, 28d supply, fill #0

## 2022-11-01 ENCOUNTER — Other Ambulatory Visit (HOSPITAL_COMMUNITY): Payer: Self-pay

## 2022-11-03 ENCOUNTER — Other Ambulatory Visit (HOSPITAL_COMMUNITY): Payer: Self-pay

## 2022-11-07 ENCOUNTER — Other Ambulatory Visit (HOSPITAL_COMMUNITY): Payer: Self-pay

## 2022-11-08 ENCOUNTER — Other Ambulatory Visit (HOSPITAL_COMMUNITY): Payer: Self-pay

## 2022-11-17 ENCOUNTER — Ambulatory Visit (HOSPITAL_BASED_OUTPATIENT_CLINIC_OR_DEPARTMENT_OTHER): Admit: 2022-11-17 | Payer: No Typology Code available for payment source | Admitting: General Surgery

## 2022-11-17 SURGERY — HEMORRHOIDECTOMY
Anesthesia: Monitor Anesthesia Care

## 2022-11-29 ENCOUNTER — Other Ambulatory Visit (HOSPITAL_COMMUNITY): Payer: Self-pay

## 2022-11-29 MED ORDER — ZEPBOUND 7.5 MG/0.5ML ~~LOC~~ SOAJ
7.5000 mg | SUBCUTANEOUS | 1 refills | Status: DC
  Filled 2022-11-29: qty 2, 28d supply, fill #0
  Filled 2023-08-28 (×2): qty 2, 28d supply, fill #1

## 2022-12-01 ENCOUNTER — Other Ambulatory Visit (HOSPITAL_COMMUNITY): Payer: Self-pay

## 2022-12-26 ENCOUNTER — Other Ambulatory Visit (HOSPITAL_COMMUNITY): Payer: Self-pay

## 2022-12-26 ENCOUNTER — Other Ambulatory Visit: Payer: Self-pay

## 2022-12-26 MED ORDER — ZEPBOUND 7.5 MG/0.5ML ~~LOC~~ SOAJ
7.5000 mg | SUBCUTANEOUS | 1 refills | Status: AC
  Filled 2022-12-26: qty 2, 28d supply, fill #0
  Filled 2023-01-27 – 2023-07-31 (×4): qty 2, 28d supply, fill #1

## 2022-12-29 ENCOUNTER — Other Ambulatory Visit (HOSPITAL_COMMUNITY): Payer: Self-pay

## 2022-12-31 ENCOUNTER — Other Ambulatory Visit (HOSPITAL_COMMUNITY): Payer: Self-pay

## 2023-01-02 ENCOUNTER — Other Ambulatory Visit (HOSPITAL_COMMUNITY): Payer: Self-pay

## 2023-01-27 ENCOUNTER — Other Ambulatory Visit (HOSPITAL_COMMUNITY): Payer: Self-pay

## 2023-01-31 ENCOUNTER — Other Ambulatory Visit: Payer: Self-pay

## 2023-02-03 ENCOUNTER — Other Ambulatory Visit (HOSPITAL_COMMUNITY): Payer: Self-pay

## 2023-02-06 ENCOUNTER — Other Ambulatory Visit (HOSPITAL_COMMUNITY): Payer: Self-pay

## 2023-02-11 ENCOUNTER — Other Ambulatory Visit (HOSPITAL_COMMUNITY): Payer: Self-pay

## 2023-02-14 ENCOUNTER — Other Ambulatory Visit (HOSPITAL_COMMUNITY): Payer: Self-pay

## 2023-02-15 ENCOUNTER — Other Ambulatory Visit (HOSPITAL_COMMUNITY): Payer: Self-pay

## 2023-03-21 ENCOUNTER — Other Ambulatory Visit (HOSPITAL_COMMUNITY): Payer: Self-pay

## 2023-05-05 ENCOUNTER — Other Ambulatory Visit: Payer: Self-pay | Admitting: Family

## 2023-05-05 DIAGNOSIS — N6310 Unspecified lump in the right breast, unspecified quadrant: Secondary | ICD-10-CM

## 2023-05-11 ENCOUNTER — Other Ambulatory Visit: Payer: No Typology Code available for payment source

## 2023-07-14 ENCOUNTER — Other Ambulatory Visit: Payer: Self-pay | Admitting: Gastroenterology

## 2023-07-14 DIAGNOSIS — K921 Melena: Secondary | ICD-10-CM

## 2023-07-14 DIAGNOSIS — R1084 Generalized abdominal pain: Secondary | ICD-10-CM

## 2023-07-14 DIAGNOSIS — R197 Diarrhea, unspecified: Secondary | ICD-10-CM

## 2023-07-20 ENCOUNTER — Other Ambulatory Visit: Payer: No Typology Code available for payment source

## 2023-07-31 ENCOUNTER — Other Ambulatory Visit (HOSPITAL_COMMUNITY): Payer: Self-pay

## 2023-08-01 ENCOUNTER — Other Ambulatory Visit (HOSPITAL_COMMUNITY): Payer: Self-pay

## 2023-08-22 ENCOUNTER — Other Ambulatory Visit (HOSPITAL_COMMUNITY): Payer: Self-pay

## 2023-08-28 ENCOUNTER — Other Ambulatory Visit (HOSPITAL_COMMUNITY): Payer: Self-pay

## 2023-09-15 ENCOUNTER — Other Ambulatory Visit (HOSPITAL_COMMUNITY): Payer: Self-pay

## 2023-09-21 ENCOUNTER — Other Ambulatory Visit (HOSPITAL_COMMUNITY): Payer: Self-pay

## 2023-09-21 MED ORDER — ZEPBOUND 7.5 MG/0.5ML ~~LOC~~ SOAJ
7.5000 mg | SUBCUTANEOUS | 3 refills | Status: AC
  Filled 2023-09-21: qty 2, 28d supply, fill #0
  Filled 2023-10-21: qty 2, 28d supply, fill #1
  Filled 2023-11-17 – 2023-11-22 (×2): qty 2, 28d supply, fill #2
  Filled 2023-11-23 – 2023-11-30 (×3): qty 2, 28d supply, fill #0
  Filled 2023-12-30: qty 2, 28d supply, fill #1

## 2023-09-23 ENCOUNTER — Other Ambulatory Visit (HOSPITAL_COMMUNITY): Payer: Self-pay

## 2023-10-21 ENCOUNTER — Other Ambulatory Visit (HOSPITAL_COMMUNITY): Payer: Self-pay

## 2023-10-23 ENCOUNTER — Other Ambulatory Visit (HOSPITAL_COMMUNITY): Payer: Self-pay

## 2023-11-17 ENCOUNTER — Other Ambulatory Visit (HOSPITAL_COMMUNITY): Payer: Self-pay

## 2023-11-19 ENCOUNTER — Other Ambulatory Visit (HOSPITAL_COMMUNITY): Payer: Self-pay

## 2023-11-21 ENCOUNTER — Other Ambulatory Visit (HOSPITAL_COMMUNITY): Payer: Self-pay

## 2023-11-22 ENCOUNTER — Other Ambulatory Visit (HOSPITAL_COMMUNITY): Payer: Self-pay

## 2023-11-23 ENCOUNTER — Other Ambulatory Visit (HOSPITAL_BASED_OUTPATIENT_CLINIC_OR_DEPARTMENT_OTHER): Payer: Self-pay

## 2023-11-23 ENCOUNTER — Other Ambulatory Visit (HOSPITAL_COMMUNITY): Payer: Self-pay

## 2023-11-30 ENCOUNTER — Other Ambulatory Visit (HOSPITAL_COMMUNITY): Payer: Self-pay

## 2023-11-30 ENCOUNTER — Other Ambulatory Visit: Payer: Self-pay

## 2023-12-30 ENCOUNTER — Other Ambulatory Visit (HOSPITAL_COMMUNITY): Payer: Self-pay

## 2024-02-23 ENCOUNTER — Other Ambulatory Visit (HOSPITAL_COMMUNITY): Payer: Self-pay

## 2024-02-23 MED ORDER — ZEPBOUND 10 MG/0.5ML ~~LOC~~ SOAJ
0.5000 mL | SUBCUTANEOUS | 3 refills | Status: AC
  Filled 2024-02-23: qty 2, 28d supply, fill #0
  Filled 2024-03-28: qty 2, 28d supply, fill #1
  Filled 2024-04-26 – 2024-05-17 (×5): qty 2, 28d supply, fill #2
  Filled 2024-08-05: qty 2, 28d supply, fill #3

## 2024-02-24 ENCOUNTER — Other Ambulatory Visit (HOSPITAL_COMMUNITY): Payer: Self-pay

## 2024-03-28 ENCOUNTER — Other Ambulatory Visit (HOSPITAL_COMMUNITY): Payer: Self-pay

## 2024-03-28 ENCOUNTER — Other Ambulatory Visit: Payer: Self-pay

## 2024-04-26 ENCOUNTER — Other Ambulatory Visit (HOSPITAL_COMMUNITY): Payer: Self-pay

## 2024-04-26 ENCOUNTER — Ambulatory Visit: Admitting: Podiatry

## 2024-05-08 ENCOUNTER — Other Ambulatory Visit (HOSPITAL_COMMUNITY): Payer: Self-pay

## 2024-05-10 ENCOUNTER — Ambulatory Visit: Admitting: Podiatry

## 2024-05-13 ENCOUNTER — Telehealth: Payer: Self-pay | Admitting: Neurology

## 2024-05-13 NOTE — Telephone Encounter (Signed)
 Pt has called stating she is having numbness and tingling in body, her memory is declining , she is under a lot of stress, she feels something is going on, pt states she does not want to go to the ED.  Pt is asking for a call from RN.  Pt was told a high priority message would be sent.

## 2024-05-13 NOTE — Telephone Encounter (Signed)
 I called pt and she relayed that she has been so stressed with work , working 64hrs last week.  Her memory is worsening , having confusion, pts daughter said like having a nervous breakdown, slurring her speech.  She has seen psychiatry, did not follow thru on neuropsych referral.  She has not see her pcp recently. Recommended evaluation.  I told her I did not have a sooner opening with Dr. Gregg, could make appt and place on waitlist.  (Phone staff had offered appt, would be in June).  She said she would think about and then call us  back.   We last saw pt in 2023.

## 2024-05-15 ENCOUNTER — Other Ambulatory Visit: Payer: Self-pay

## 2024-05-17 ENCOUNTER — Other Ambulatory Visit: Payer: Self-pay

## 2024-05-17 ENCOUNTER — Other Ambulatory Visit (HOSPITAL_COMMUNITY): Payer: Self-pay

## 2024-05-18 ENCOUNTER — Other Ambulatory Visit (HOSPITAL_COMMUNITY): Payer: Self-pay

## 2024-06-13 ENCOUNTER — Other Ambulatory Visit: Payer: Self-pay | Admitting: Family Medicine

## 2024-06-13 DIAGNOSIS — R413 Other amnesia: Secondary | ICD-10-CM

## 2024-06-17 ENCOUNTER — Ambulatory Visit (HOSPITAL_COMMUNITY)
Admission: RE | Admit: 2024-06-17 | Discharge: 2024-06-17 | Disposition: A | Source: Ambulatory Visit | Attending: Family Medicine | Admitting: Family Medicine

## 2024-06-17 ENCOUNTER — Other Ambulatory Visit (HOSPITAL_COMMUNITY): Payer: Self-pay | Admitting: Family Medicine

## 2024-06-17 DIAGNOSIS — R413 Other amnesia: Secondary | ICD-10-CM

## 2024-06-17 DIAGNOSIS — R519 Headache, unspecified: Secondary | ICD-10-CM

## 2024-06-17 DIAGNOSIS — H538 Other visual disturbances: Secondary | ICD-10-CM | POA: Insufficient documentation

## 2024-06-17 MED ORDER — GADOBUTROL 1 MMOL/ML IV SOLN
7.5000 mL | Freq: Once | INTRAVENOUS | Status: AC | PRN
  Administered 2024-06-17: 17:00:00 7.5 mL via INTRAVENOUS

## 2024-06-20 ENCOUNTER — Telehealth: Payer: Self-pay | Admitting: Neurology

## 2024-06-20 NOTE — Telephone Encounter (Signed)
 Patient called to request to be seen earlier, having some memory loss to the point cannot remember address. MRI ordered by Dr. Teresa showed Meningioma.

## 2024-07-01 ENCOUNTER — Other Ambulatory Visit

## 2024-07-02 ENCOUNTER — Encounter: Payer: Self-pay | Admitting: Neurology

## 2024-07-02 ENCOUNTER — Ambulatory Visit: Admitting: Neurology

## 2024-07-02 VITALS — BP 118/68 | HR 58 | Ht 65.0 in | Wt 142.5 lb

## 2024-07-02 DIAGNOSIS — F419 Anxiety disorder, unspecified: Secondary | ICD-10-CM | POA: Diagnosis not present

## 2024-07-02 DIAGNOSIS — D329 Benign neoplasm of meninges, unspecified: Secondary | ICD-10-CM | POA: Diagnosis not present

## 2024-07-02 DIAGNOSIS — F32A Depression, unspecified: Secondary | ICD-10-CM

## 2024-07-02 DIAGNOSIS — R4189 Other symptoms and signs involving cognitive functions and awareness: Secondary | ICD-10-CM | POA: Diagnosis not present
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
## 2024-07-02 NOTE — Patient Instructions (Signed)
 Continue current medications  Referral to psychology for management of ongoing stress Continue to follow up with PCP and Psychiatry  Return as needed

## 2024-07-03 ENCOUNTER — Telehealth: Payer: Self-pay | Admitting: Neurology

## 2024-07-03 NOTE — Telephone Encounter (Signed)
 Referral to Psychology sent thru epic to [facility]  [facility] Phone : [phone]

## 2024-08-05 ENCOUNTER — Other Ambulatory Visit (HOSPITAL_COMMUNITY): Payer: Self-pay

## 2024-10-15 ENCOUNTER — Ambulatory Visit: Admitting: Neurology
